# Patient Record
Sex: Male | Born: 1995
Health system: Southern US, Community
[De-identification: ages and names within clinical notes are randomized; demographics above are authoritative.]

## PROBLEM LIST (undated history)

## (undated) HISTORY — PX: NO PAST SURGERIES: SHX2092

---
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
## 2017-06-02 ENCOUNTER — Encounter: Payer: Self-pay | Admitting: *Deleted

## 2017-06-02 ENCOUNTER — Ambulatory Visit
Admission: EM | Admit: 2017-06-02 | Discharge: 2017-06-02 | Disposition: A | Discharge status: Home / Self Care | Payer: 59 | Attending: Family Medicine | Admitting: Family Medicine

## 2017-06-02 DIAGNOSIS — L42 Pityriasis rosea: Secondary | ICD-10-CM

## 2017-06-02 MED ORDER — HYDROXYZINE HCL 25 MG PO TABS: 25.0000 mg | ORAL_TABLET | Freq: Four times a day (QID) | ORAL | 0 refills | Status: DC | PRN

## 2017-06-02 NOTE — ED Triage Notes (Signed)
C/O rash to chest, back, and arms with itching. Started having these symptoms 3 days ago

## 2017-06-02 NOTE — Discharge Instructions (Signed)
Take medication as prescribed.Avoid scratching.  ° °Follow up with your primary care physician this week as needed. Return to Urgent care for new or worsening concerns.  ° °

## 2017-06-02 NOTE — ED Provider Notes (Signed)
[facility] ____________________________________________  Time seen: Approximately 10:23 AM  I have reviewed the triage vital signs and the nursing notes.   HISTORY  Chief Complaint Rash   HPI Dennis Maldonado is a 21 y.o. male presenting for evaluation of itchy rash that is been to his torso and upper extremities over the last 3 days.  States rash started to his torso and has gone outwards to his extremities.  States rash is itchy, but not painful or irritating.  Patient denies any known triggers.  Denies changes in foods, medicines, lotions, detergents or other contacts.  Denies others at home with similar.  Denies history of similar in the past.  Reports otherwise feels well.  Denies any cough congestion sickness, fevers, chest pain, shortness of breath, swelling or other complaints.  Denies any recent insect bite or tick bite. Denies recent sickness. Denies recent antibiotic use.  Denies any groin rash, or rash to hands or feet.  Denies concerns of STDs.   History reviewed. No pertinent past medical history. Denies There are no active problems to display for this patient.   History reviewed. No pertinent surgical history.   No current facility-administered medications for this encounter.   Current Outpatient Medications:  .  hydrOXYzine (ATARAX/VISTARIL) 25 MG tablet, Take 1 tablet (25 mg total) every 6 (six) hours as needed by mouth for itching., Disp: 20 tablet, Rfl: 0  Allergies Patient has no allergy information on record.  History reviewed. No pertinent family history.  Social History Social History   Tobacco Use  . Smoking status: Never Smoker  . Smokeless tobacco: Former Engineer, water Use Topics  . Alcohol use: No    Frequency: Never  . Drug use: Not on file    Review of Systems Constitutional: No fever/chills ENT: No sore throat. Cardiovascular: Denies chest pain. Respiratory: Denies shortness of breath. Gastrointestinal: No abdominal  pain.  No nausea, no vomiting.  Genitourinary: Negative for dysuria. Musculoskeletal: Negative for back pain. Skin: Positive for rash.   ____________________________________________   PHYSICAL EXAM:  VITAL SIGNS: ED Triage Vitals  Enc Vitals Group     BP 06/02/17 0943 (!) 114/59     Pulse Rate 06/02/17 0943 (!) 45 Recheck 56     Resp 06/02/17 0943 14     Temp 06/02/17 0943 98.1 F (36.7 C)     Temp Source 06/02/17 0943 Oral     SpO2 06/02/17 0943 100 %     Weight 06/02/17 0945 175 lb (79.4 kg)     Height 06/02/17 0945 5\' 11"  (1.803 m)     Head Circumference --      Peak Flow --      Pain Score --      Pain Loc --      Pain Edu? --      Excl. in GC? 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  Constitutional: Alert and oriented. Well appearing and in no acute distress. ENT      Head: Normocephalic and atraumatic.      Nose: No congestion/rhinnorhea. Neck: No stridor. Supple without meningismus.  Hematological/Lymphatic/Immunilogical: No cervical lymphadenopathy. Cardiovascular: Normal rate, regular rhythm. Grossly normal heart sounds.  Good peripheral circulation. Respiratory: Normal respiratory effort without tachypnea nor retractions. Breath sounds are clear and equal bilaterally. No wheezes, rales, rhonchi. Gastrointestinal: Soft and nontender. Musculoskeletal: Steady gait Neurologic:  Normal speech and language.Speech is normal. No gait instability.  Skin:  Skin is warm, dry.except: pruritic  dry scaly oval shaped patches throughout torso and upper extremities with larger approximately 2.5cm in diameter area to left back with similar appearance, no erythema, no drainage or swelling, nontender, no rash to lower extremities, no rash to palms or hands, and no burrows noted.  Psychiatric: Mood and affect are normal. Speech and behavior are normal. Patient exhibits appropriate insight and judgment   ___________________________________________     LABS (all labs ordered are listed, but only abnormal results are displayed)  Labs Reviewed - No data to display ____________________________________________  PROCEDURES Procedures     INITIAL IMPRESSION / ASSESSMENT AND PLAN / ED COURSE  Pertinent labs & imaging results that were available during my care of the patient were reviewed by me and considered in my medical decision making (see chart for details).  Well-appearing patient.  No acute distress.  Rash clinical appearance consistent with pityriasis rosea, with heralds patch. Will treat hydroxyzine as needed.  Also noted bradycardia, suspect patient baseline.  Patient reports frequent running and basketball playing.  Denies any cardiac history or current complaints, encourage follow up. Discussed indication, risks and benefits of medications with patient. Discussed indication, risks and benefits of medications with patient.  Discussed follow up with Primary care physician this week. Discussed follow up and return parameters including no resolution or any worsening concerns. Patient verbalized understanding and agreed to plan.   ____________________________________________   FINAL CLINICAL IMPRESSION(S) / ED DIAGNOSES  Final diagnoses:  Pityriasis rosea     This SmartLink is deprecated. Use AVSMEDLIST instead to display the medication list for a patient.  Note: This dictation was prepared with Dragon dictation along with smaller phrase technology. Any transcriptional errors that result from this process are unintentional.         Renford DillsMiller, [place], NP 06/02/17 [phone]

## 2018-09-16 ENCOUNTER — Other Ambulatory Visit: Payer: Self-pay

## 2018-09-16 ENCOUNTER — Ambulatory Visit
Admission: EM | Admit: 2018-09-16 | Discharge: 2018-09-16 | Disposition: A | Discharge status: Home / Self Care | Payer: 59 | Attending: Family Medicine | Admitting: Family Medicine

## 2018-09-16 ENCOUNTER — Ambulatory Visit (INDEPENDENT_AMBULATORY_CARE_PROVIDER_SITE_OTHER): Payer: 59

## 2018-09-16 DIAGNOSIS — R69 Illness, unspecified: Principal | ICD-10-CM

## 2018-09-16 DIAGNOSIS — R0602 Shortness of breath: Secondary | ICD-10-CM | POA: Diagnosis not present

## 2018-09-16 DIAGNOSIS — R062 Wheezing: Secondary | ICD-10-CM

## 2018-09-16 DIAGNOSIS — R509 Fever, unspecified: Secondary | ICD-10-CM | POA: Diagnosis not present

## 2018-09-16 DIAGNOSIS — J111 Influenza due to unidentified influenza virus with other respiratory manifestations: Secondary | ICD-10-CM

## 2018-09-16 DIAGNOSIS — R05 Cough: Secondary | ICD-10-CM | POA: Diagnosis not present

## 2018-09-16 LAB — RAPID INFLUENZA A&B ANTIGENS (ARMC ONLY): INFLUENZA B (ARMC): NEGATIVE

## 2018-09-16 LAB — RAPID INFLUENZA A&B ANTIGENS: Influenza A (ARMC): NEGATIVE

## 2018-09-16 MED ORDER — IBUPROFEN 800 MG PO TABS: 800.0000 mg | ORAL_TABLET | Freq: Three times a day (TID) | ORAL | 0 refills | Status: DC | PRN

## 2018-09-16 MED ORDER — OSELTAMIVIR PHOSPHATE 75 MG PO CAPS: 75.0000 mg | ORAL_CAPSULE | Freq: Two times a day (BID) | ORAL | 0 refills | Status: DC

## 2018-09-16 MED ORDER — BENZONATATE 100 MG PO CAPS: 100.0000 mg | ORAL_CAPSULE | Freq: Three times a day (TID) | ORAL | 0 refills | Status: DC | PRN

## 2018-09-16 MED ORDER — ALBUTEROL SULFATE (2.5 MG/3ML) 0.083% IN NEBU
2.5000 mg | INHALATION_SOLUTION | Freq: Once | RESPIRATORY_TRACT | Status: AC
  Administered 2018-09-16: 2.5 mg via RESPIRATORY_TRACT

## 2018-09-16 MED ORDER — ALBUTEROL SULFATE HFA 108 (90 BASE) MCG/ACT IN AERS: 1.0000 | INHALATION_SPRAY | Freq: Four times a day (QID) | RESPIRATORY_TRACT | 0 refills | Status: DC | PRN

## 2018-09-16 NOTE — Discharge Instructions (Signed)
Rest. Fluids.  Medications as directed.  Take care  Dr. Euel Castile 

## 2018-09-16 NOTE — ED Provider Notes (Signed)
[facility]    CSN: [id number] Arrival date & time: 09/16/18  0851  History   Chief Complaint Chief Complaint  Patient presents with  . Fever   HPI  23 year old male presents with fever, body aches, cough, and SOB.  Patient reports that his symptoms started last night.  He reports fever, T-max 101.  He has had body aches and back pain as well as cough.  Also reports shortness of breath.  Mother has given Tylenol and NyQuil without resolution.  Symptoms are severe.  Seems to be worsening.  No known exacerbating factors.  No other associated symptoms.  No other complaints.  History reviewed as below. PMH: Hx of pityriasis rosea  Past Surgical History:  Procedure Laterality Date  . NO PAST SURGERIES     Social History Social History   Tobacco Use  . Smoking status: Never Smoker  . Smokeless tobacco: Former Engineer, water Use Topics  . Alcohol use: No    Frequency: Never  . Drug use: Never    Allergies   Patient has no known allergies.  Review of Systems Review of Systems  Constitutional: Positive for fever.  Respiratory: Positive for cough and shortness of breath.   Musculoskeletal:       Back pain/body aches.   Physical Exam Triage Vital Signs ED Triage Vitals  Enc Vitals Group     BP 09/16/18 0912 116/71     Pulse Rate 09/16/18 0912 (!) 107     Resp 09/16/18 0912 (!) 22     Temp 09/16/18 0912 100.3 F (37.9 C)     Temp Source 09/16/18 0912 Oral     SpO2 09/16/18 0912 95 %     Weight 09/16/18 0908 160 lb (72.6 kg)     Height 09/16/18 0908 6' (1.829 m)     Head Circumference --      Peak Flow --      Pain Score 09/16/18 0908 7     Pain Loc --      Pain Edu? --      Excl. in GC? --    Updated Vital Signs BP 116/71 (BP Location: Left Arm)   Pulse (!) 107   Temp 100.3 F (37.9 C) (Oral)   Resp (!) 22   Ht 6' (1.829 m)   Wt 72.6 kg   SpO2 95%   BMI 21.70 kg/m   Visual Acuity Right Eye Distance:   Left Eye Distance:   Bilateral  Distance:    Right Eye Near:   Left Eye Near:    Bilateral Near:     Physical Exam Vitals signs and nursing note reviewed.  Constitutional:      General: He is not in acute distress.    Appearance: He is normal weight.  HENT:     Head: Normocephalic and atraumatic.     Right Ear: Tympanic membrane normal.     Left Ear: Tympanic membrane normal.     Nose: Nose normal.     Mouth/Throat:     Comments: Oropharynx with mild erythema.  No exudate. Eyes:     General:        Right eye: No discharge.        Left eye: No discharge.     Conjunctiva/sclera: Conjunctivae normal.  Cardiovascular:     Rate and Rhythm: Regular rhythm. Tachycardia present.  Pulmonary:     Breath sounds: Wheezing present. No rhonchi or rales.  Neurological:     Mental  Status: He is alert.  Psychiatric:        Behavior: Behavior normal.     Comments: Flat affect.    UC Treatments / Results  Labs (all labs ordered are listed, but only abnormal results are displayed) Labs Reviewed  RAPID INFLUENZA A&B ANTIGENS [facility] ONLY)    EKG None  Radiology Dg Chest 2 View  Result Date: 09/16/2018 CLINICAL DATA:  Fever and cough.  Shortness of breath EXAM: CHEST - 2 VIEW COMPARISON:  None. FINDINGS: Lungs are clear. Heart size and pulmonary vascularity are normal. No adenopathy. No bone lesions. IMPRESSION: No edema or consolidation. Electronically Signed   By: Bretta Bang III M.D.   On: 09/16/2018 10:20    Procedures Procedures (including critical care time)  Medications Ordered in UC Medications  albuterol (PROVENTIL) (2.5 MG/3ML) 0.083% nebulizer solution 2.5 mg (2.5 mg Nebulization Given 09/16/18 0938)    Initial Impression / Assessment and Plan / UC Course  I have reviewed the triage vital signs and the nursing notes.  Pertinent labs & imaging results that were available during my care of the patient were reviewed by me and considered in my medical decision making (see chart for details).      23 year old male presents with influenza-like illness.  Albuterol given today given wheezing on exam.  Chest x-ray was negative.  Influenza test was negative.  However, clinically I suspect influenza.  Treating him with Tamiflu, ibuprofen, Tessalon Perles, and albuterol.  Final Clinical Impressions(s) / UC Diagnoses   Final diagnoses:  Influenza-like illness     Discharge Instructions     Rest.  Fluids.  Medications as directed.  Take care  Dr. Adriana Simas     ED Prescriptions    Medication Sig Dispense Auth. Provider   oseltamivir (TAMIFLU) 75 MG capsule Take 1 capsule (75 mg total) by mouth every 12 (twelve) hours. 10 capsule Benay Pomeroy G, DO   benzonatate (TESSALON) 100 MG capsule Take 1 capsule (100 mg total) by mouth 3 (three) times daily as needed. 30 capsule [place]ingham Cohick G, DO   ibuprofen (ADVIL,MOTRIN) 800 MG tablet Take 1 tablet (800 mg total) by mouth every 8 (eight) hours as needed for moderate pain. 30 tablet Cherae Marton G, DO   albuterol (PROVENTIL HFA;VENTOLIN HFA) 108 (90 Base) MCG/ACT inhaler Inhale 1-2 puffs into the lungs every 6 (six) hours as needed for wheezing or shortness of breath. 1 Inhaler Tommie Sams, DO     Controlled Substance Prescriptions [place] Controlled Substance Registry consulted? Not Applicable   Tommie Sams, DO 09/16/18 1043

## 2018-09-16 NOTE — ED Triage Notes (Signed)
Patient complains of cough, fever, body aches, shortness of breath that started yesterday.

## 2018-09-20 ENCOUNTER — Ambulatory Visit
Admission: EM | Admit: 2018-09-20 | Discharge: 2018-09-20 | Disposition: A | Discharge status: Home / Self Care | Payer: 59 | Attending: Physician Assistant | Admitting: Physician Assistant

## 2018-09-20 ENCOUNTER — Other Ambulatory Visit: Payer: Self-pay

## 2018-09-20 DIAGNOSIS — J111 Influenza due to unidentified influenza virus with other respiratory manifestations: Secondary | ICD-10-CM

## 2018-09-20 DIAGNOSIS — J209 Acute bronchitis, unspecified: Secondary | ICD-10-CM | POA: Diagnosis not present

## 2018-09-20 MED ORDER — PREDNISONE 20 MG PO TABS: 40.0000 mg | ORAL_TABLET | Freq: Every day | ORAL | 0 refills | Status: AC

## 2018-09-20 MED ORDER — PSEUDOEPH-BROMPHEN-DM 30-2-10 MG/5ML PO SYRP: ORAL_SOLUTION | ORAL | 0 refills | Status: DC

## 2018-09-20 NOTE — ED Provider Notes (Signed)
[facility]    CSN: [id number] Arrival date & time: 09/20/18  0910     History   Chief Complaint No chief complaint on file.   HPI Dennis Maldonado is a 23 y.o. male. Patient returns today for follow up of influenza diagnosed at [facility] four days ago. Patient tested negative for influenza and displayed an normal chest x-ray. He was clinically treated for the flu with tamiflu, benzonatate, albuterol inhaler, and ibuprofen. He says that he does feel a little better from his previous visit, but is concerned since he has had continued temps up to 100 degrees. He says he continues to have headaches although they are not as bad. He denies chills, sweats, and fatigue is improving. He denies sinus pain, nasal congestion, chest pain, wheezing and breathing problems. He says he believes the cough medication is not helping and he may need a few more days to rest. He has no other concerns today and says symptoms have not worsened, and again, seem to be improving.   HPI  No past medical history on file.  There are no active problems to display for this patient.   Past Surgical History:  Procedure Laterality Date  . NO PAST SURGERIES         Home Medications    Prior to Admission medications   Medication Sig Start Date End Date Taking? Authorizing Provider  albuterol (PROVENTIL HFA;VENTOLIN HFA) 108 (90 Base) MCG/ACT inhaler Inhale 1-2 puffs into the lungs every 6 (six) hours as needed for wheezing or shortness of breath. 09/16/18  Yes Cook, Jayce G, DO  benzonatate (TESSALON) 100 MG capsule Take 1 capsule (100 mg total) by mouth 3 (three) times daily as needed. 09/16/18  Yes Tommie Sams, DO  oseltamivir (TAMIFLU) 75 MG capsule Take 1 capsule (75 mg total) by mouth every 12 (twelve) hours. 09/16/18  Yes Everlene Other G, DO  brompheniramine-pseudoephedrine-DM 30-2-10 MG/5ML syrup Take 5-10 ml q6h for cough x 7 days 09/20/18   Shirlee Latch, PA-C  ibuprofen (ADVIL,MOTRIN)  800 MG tablet Take 1 tablet (800 mg total) by mouth every 8 (eight) hours as needed for moderate pain. 09/16/18   Tommie Sams, DO  predniSONE (DELTASONE) 20 MG tablet Take 2 tablets (40 mg total) by mouth daily for 5 days. 09/20/18 09/25/18  Shirlee Latch, PA-C    Family History No family history on file.  Social History Social History   Tobacco Use  . Smoking status: Never Smoker  . Smokeless tobacco: Former Engineer, water Use Topics  . Alcohol use: No    Frequency: Never  . Drug use: Never     Allergies   Patient has no known allergies.   Review of Systems Review of Systems  Constitutional: Positive for fatigue and fever. Negative for chills.  HENT: Positive for congestion. Negative for ear pain, rhinorrhea, sinus pressure, sinus pain and sore throat.   Respiratory: Positive for cough. Negative for shortness of breath and wheezing.   Cardiovascular: Negative for chest pain.  Gastrointestinal: Negative for abdominal pain, nausea and vomiting.  Musculoskeletal: Negative for myalgias, neck pain and neck stiffness.  Neurological: Positive for headaches. Negative for dizziness, weakness and light-headedness.  Hematological: Negative for adenopathy.     Physical Exam Triage Vital Signs ED Triage Vitals  Enc Vitals Group     BP 09/20/18 0921 113/78     Pulse Rate 09/20/18 0921 86     Resp 09/20/18 0921 16  Temp 09/20/18 0921 98.5 F (36.9 C)     Temp Source 09/20/18 0921 Oral     SpO2 09/20/18 0921 96 %     Weight 09/20/18 0923 160 lb (72.6 kg)     Height --      Head Circumference --      Peak Flow --      Pain Score 09/20/18 0923 7     Pain Loc --      Pain Edu? --      Excl. in GC? --    No data found.  Updated Vital Signs BP 113/78 (BP Location: Left Arm)   Pulse 86   Temp 98.5 F (36.9 C) (Oral)   Resp 16   Wt 160 lb (72.6 kg)   SpO2 96%   BMI 21.70 kg/m       Physical Exam Vitals signs and nursing note reviewed.  Constitutional:       General: He is not in acute distress.    Appearance: Normal appearance. He is normal weight. He is not ill-appearing.  HENT:     Head: Normocephalic and atraumatic.     Right Ear: Tympanic membrane, ear canal and external ear normal.     Left Ear: Tympanic membrane, ear canal and external ear normal.     Nose: Nose normal. No congestion or rhinorrhea.     Mouth/Throat:     Mouth: Mucous membranes are moist.     Pharynx: Oropharynx is clear. No posterior oropharyngeal erythema.  Eyes:     General: No scleral icterus.       Right eye: No discharge.        Left eye: No discharge.     Conjunctiva/sclera: Conjunctivae normal.  Neck:     Musculoskeletal: Neck supple. No neck rigidity.  Cardiovascular:     Rate and Rhythm: Normal rate and regular rhythm.     Heart sounds: Normal heart sounds. No murmur.  Pulmonary:     Effort: Pulmonary effort is normal. No respiratory distress.     Breath sounds: Normal breath sounds. No wheezing or rhonchi.  Abdominal:     Palpations: Abdomen is soft.     Tenderness: There is no abdominal tenderness.  Lymphadenopathy:     Cervical: No cervical adenopathy.  Skin:    General: Skin is warm and dry.     Findings: No rash.  Neurological:     Mental Status: He is alert.  Psychiatric:        Mood and Affect: Mood normal.        Behavior: Behavior normal.        Thought Content: Thought content normal.      UC Treatments / Results  Labs (all labs ordered are listed, but only abnormal results are displayed) Labs Reviewed - No data to display  EKG None  Radiology No results found.  Procedures Procedures (including critical care time)  Medications Ordered in UC Medications - No data to display  Initial Impression / Assessment and Plan / UC Course  I have reviewed the triage vital signs and the nursing notes.  Pertinent labs & imaging results that were available during my care of the patient were reviewed by me and considered in my medical  decision making (see chart for details).      Final Clinical Impressions(s) / UC Diagnoses   Final diagnoses:  Influenza  Acute bronchitis, unspecified organism     Discharge Instructions     FLU: Stressed the need to  increase rest and fluid/electrolyte intake . Use medications as directed including Tamiflu and OTC cough medications. If breathing becomes a concern follow back up with [place] or go to the ER. Avoid others and do not return to work/school too soon in order to prevent others from becoming infected. Most get better within 1-2 wks. Call PCP if trouble breathing or are short of breath, feel pain or pressure in your chest or abdomen, get dizzy, feel confused, or have vomiting .   BRONCHITIS: Take Mucinex D or prescribed cough medication throughout the day and drink plenty of fluids to break up the mucus . Take cough syrup at bedtime if needed, for cough and to assist with sleep. Take Ibuprofen or other NSAID for relief of any pleuritic pain. Increase rest and fluid intake. Return to the clinic, your PCP, or ER if you have any new/ worsening symptoms such as fever, chest pain, difficulty breathing, worsening cough, mental status changes, lethargy, etc.     ED Prescriptions    Medication Sig Dispense Auth. Provider   predniSONE (DELTASONE) 20 MG tablet Take 2 tablets (40 mg total) by mouth daily for 5 days. 10 tablet Eusebio Friendly B, PA-C   brompheniramine-pseudoephedrine-DM 30-2-10 MG/5ML syrup Take 5-10 ml q6h for cough x 7 days 250 mL Eusebio Friendly B, PA-C     Controlled Substance Prescriptions [place] Controlled Substance Registry consulted? Not Applicable   Gareth Morgan 09/20/18 1610

## 2018-09-20 NOTE — Discharge Instructions (Addendum)
FLU: Stressed the need to increase rest and fluid/electrolyte intake . Use medications as directed including Tamiflu and OTC cough medications. If breathing becomes a concern follow back up with [place] or go to the ER. Avoid others and do not return to work/school too soon in order to prevent others from becoming infected. Most get better within 1-2 wks. Call PCP if trouble breathing or are short of breath, feel pain or pressure in your chest or abdomen, get dizzy, feel confused, or have vomiting .   BRONCHITIS: Take Mucinex D or prescribed cough medication throughout the day and drink plenty of fluids to break up the mucus . Take cough syrup at bedtime if needed, for cough and to assist with sleep. Take Ibuprofen or other NSAID for relief of any pleuritic pain. Increase rest and fluid intake. Return to the clinic, your PCP, or ER if you have any new/ worsening symptoms such as fever, chest pain, difficulty breathing, worsening cough, mental status changes, lethargy, etc.

## 2018-09-20 NOTE — ED Triage Notes (Signed)
Per patient was seen x 4 days ago for fever/ headache, per patient still having a fever and not getting better

## 2019-12-28 ENCOUNTER — Ambulatory Visit: Payer: Self-pay | Admitting: Physician Assistant

## 2019-12-28 ENCOUNTER — Encounter: Payer: Self-pay | Admitting: Physician Assistant

## 2019-12-28 ENCOUNTER — Other Ambulatory Visit: Payer: Self-pay

## 2019-12-28 DIAGNOSIS — Z202 Contact with and (suspected) exposure to infections with a predominantly sexual mode of transmission: Secondary | ICD-10-CM

## 2019-12-28 DIAGNOSIS — Z113 Encounter for screening for infections with a predominantly sexual mode of transmission: Secondary | ICD-10-CM

## 2019-12-28 LAB — GRAM STAIN

## 2019-12-28 MED ORDER — DOXYCYCLINE HYCLATE 100 MG PO TABS: 100.0000 mg | ORAL_TABLET | Freq: Two times a day (BID) | ORAL | 0 refills | Status: AC

## 2019-12-28 MED ORDER — CEFTRIAXONE SODIUM 250 MG IJ SOLR
500.0000 mg | Freq: Once | INTRAMUSCULAR | Status: AC
  Administered 2019-12-28: 500 mg via INTRAMUSCULAR

## 2019-12-28 NOTE — Progress Notes (Signed)
   [facility] Department STI clinic/screening visit  Subjective:  Dennis Maldonado is a 24 y.o. male being seen today for an STI screening visit. The patient reports they do not have symptoms.    Patient has the following medical conditions:  There are no problems to display for this patient.    Chief Complaint  Patient presents with  . SEXUALLY TRANSMITTED DISEASE    screening    HPI  Patient reports that he does not have any symptoms but is a contact to GC.  Denies chronic conditions, surgeries and regular medications.  Denies previous HIV testing.   See flowsheet for further details and programmatic requirements.    The following portions of the patient's history were reviewed and updated as appropriate: allergies, current medications, past medical history, past social history, past surgical history and problem list.  Objective:  There were no vitals filed for this visit.  Physical Exam Constitutional:      General: He is not in acute distress.    Appearance: Normal appearance.  HENT:     Head: Normocephalic and atraumatic.     Comments: No nits, lice or hair loss. No cervical, supraclavicular or axillary adenopathy.     Mouth/Throat:     Mouth: Mucous membranes are moist.     Pharynx: Oropharynx is clear. No oropharyngeal exudate or posterior oropharyngeal erythema.  Eyes:     Conjunctiva/sclera: Conjunctivae normal.  Pulmonary:     Effort: Pulmonary effort is normal.  Abdominal:     Palpations: Abdomen is soft. There is no mass.     Tenderness: There is no abdominal tenderness. There is no guarding or rebound.  Genitourinary:    Penis: Normal.      Testes: Normal.     Comments: Pubic area without nits, lice, edema,erythema, lesions and inguinal adenopathy. Penis circumcised, without rash, lesions and discharge from meatus. Musculoskeletal:     Cervical back: Neck supple. No tenderness.  Skin:    General: Skin is warm and dry.     Findings: No  bruising, erythema, lesion or rash.  Neurological:     Mental Status: He is alert and oriented to person, place, and time.  Psychiatric:        Mood and Affect: Mood normal.        Behavior: Behavior normal.        Thought Content: Thought content normal.        Judgment: Judgment normal.       Assessment and Plan:  Dennis Maldonado is a 24 y.o. male presenting to the [facility] for STI screening  1. Screening for STD (sexually transmitted disease) Patient into clinic without symptoms. Rec condoms with all sex. Await test results.  Counseled that RN will call if needs to RTC for further treatment once results are back.  - Gram stain - Gonococcus culture - HIV/HCV [place] [facility] - Syphilis Serology,  Lab  2. Gonorrhea contact Will treat as a contact to [facility] with Ceftriaxone 500mg  IM and Doxycycline 100mg  #14 1 po BID for 7 days. No sex until 7 days after medicine completed and until after partner completes treatment. - cefTRIAXone (ROCEPHIN) injection 500 mg - doxycycline (VIBRA-TABS) 100 MG tablet; Take 1 tablet (100 mg total) by mouth 2 (two) times daily for 7 days.  Dispense: 14 tablet; Refill: 0     No follow-ups on file.  No future appointments.  , PA

## 2020-01-01 LAB — GONOCOCCUS CULTURE

## 2020-01-02 NOTE — Progress Notes (Signed)
Chart reviewed by Pharmacist  Suzanne Walker PharmD, Contract Pharmacist at [facility]  

## 2020-01-05 LAB — HM HIV SCREENING LAB: HM HIV Screening: NEGATIVE

## 2020-01-05 LAB — HM HEPATITIS C SCREENING LAB: HM Hepatitis Screen: NEGATIVE

## 2020-01-07 ENCOUNTER — Encounter: Payer: Self-pay | Admitting: Family Medicine

## 2020-02-27 IMAGING — CR DG CHEST 2V
2 series · 3 of 3 positions shown · non-contrast
Comparison: None.

CLINICAL DATA: Fever and cough.  Shortness of breath

EXAM:
CHEST - 2 VIEW

[Series 1: chest pa · 0.14mm/px · 2 of 2 slices shown]
[im 1/2]
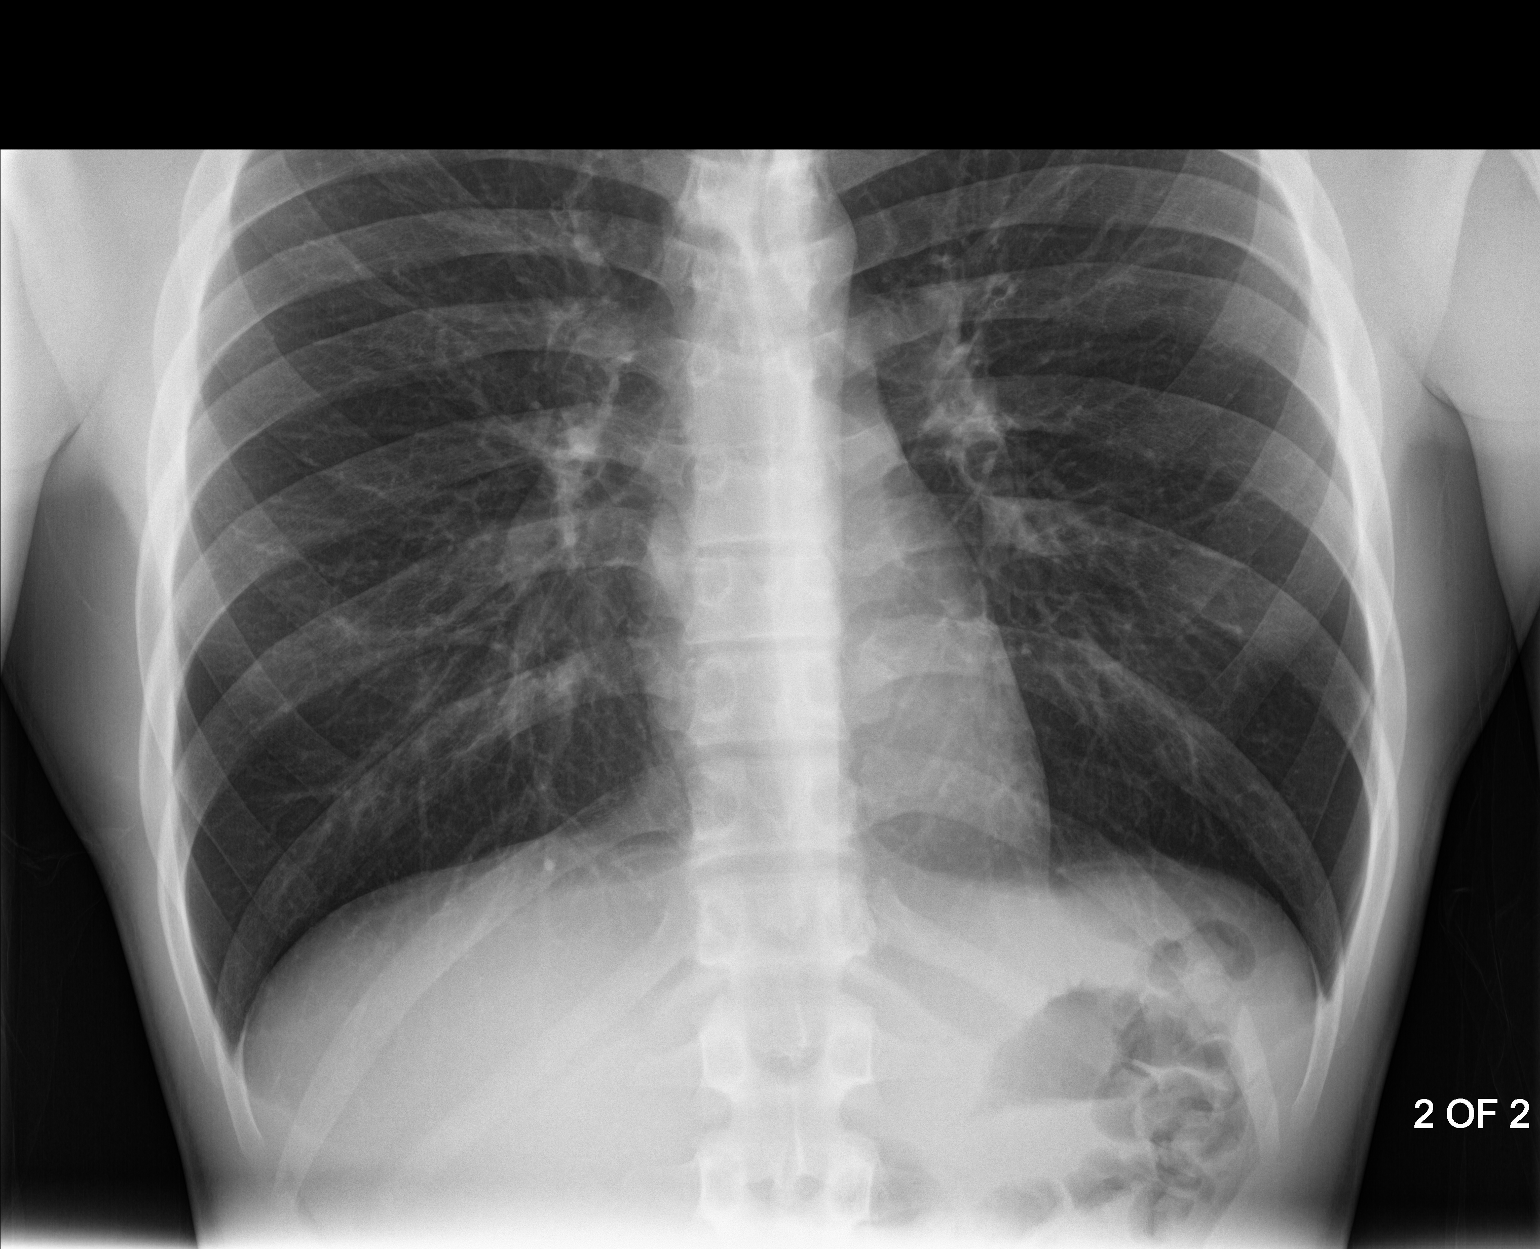
[im 2/2]
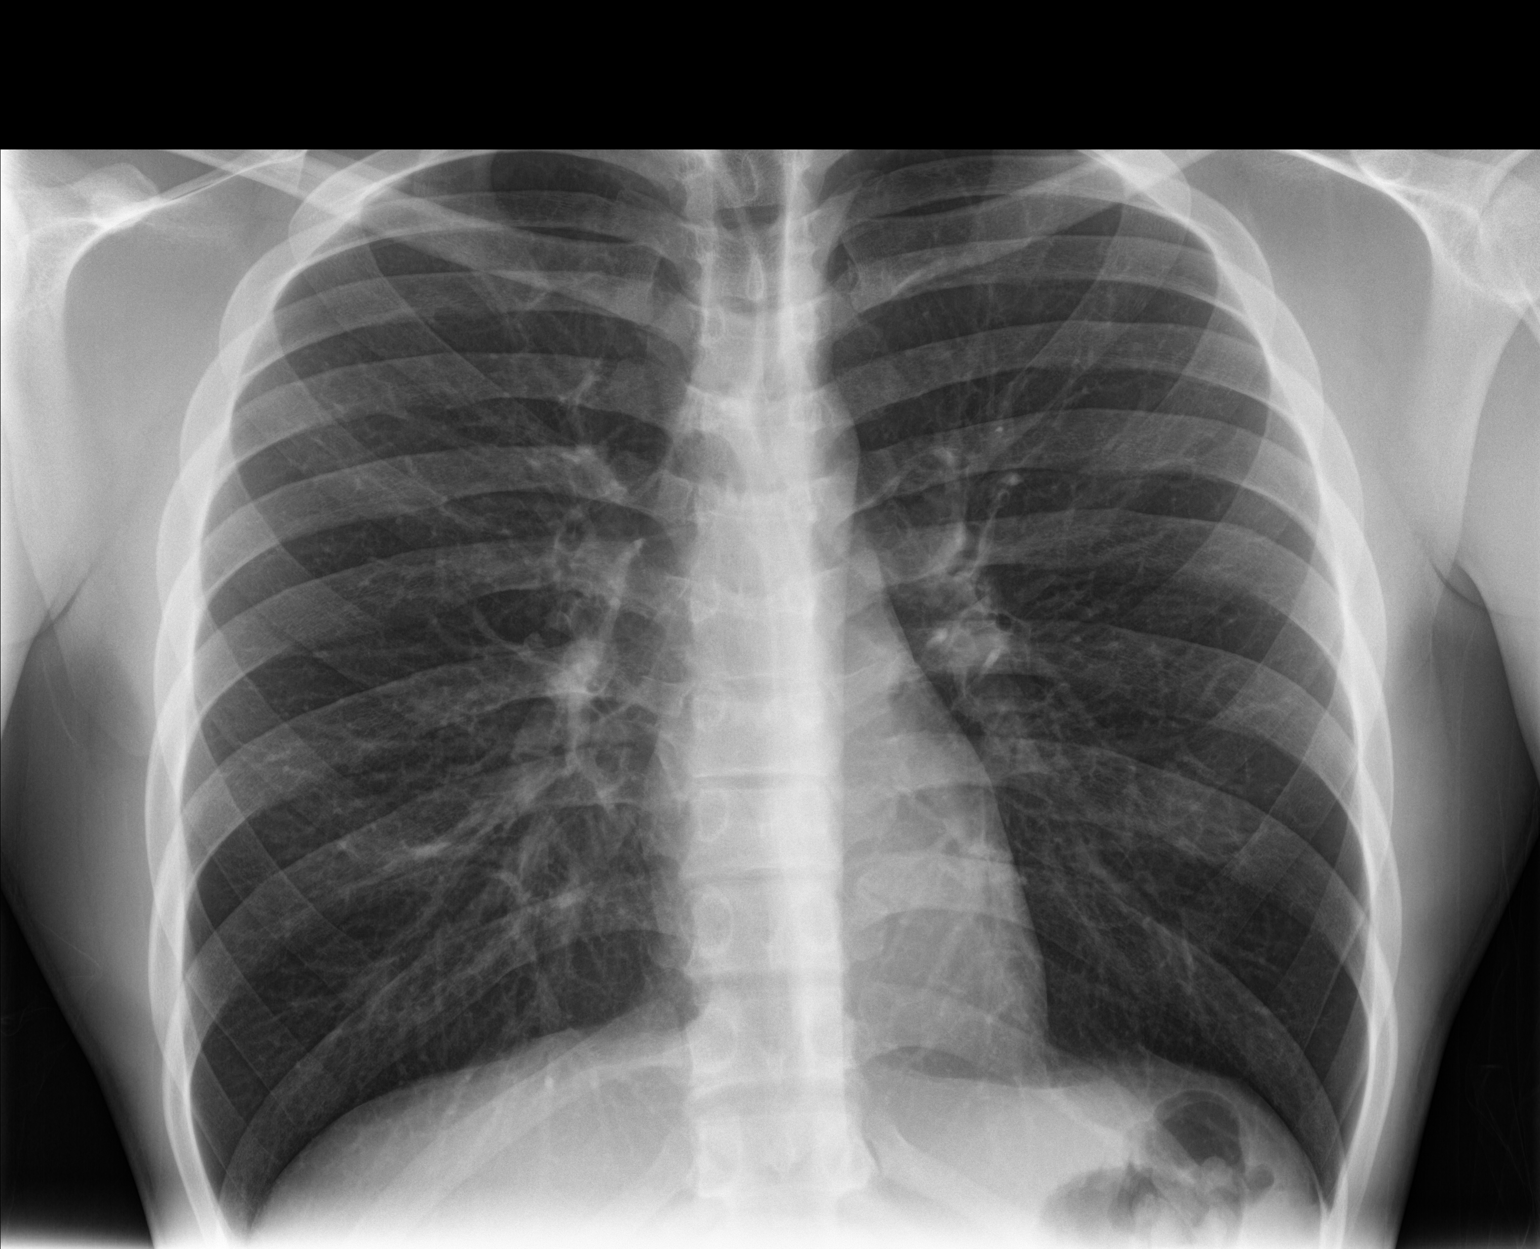

[chest lat]
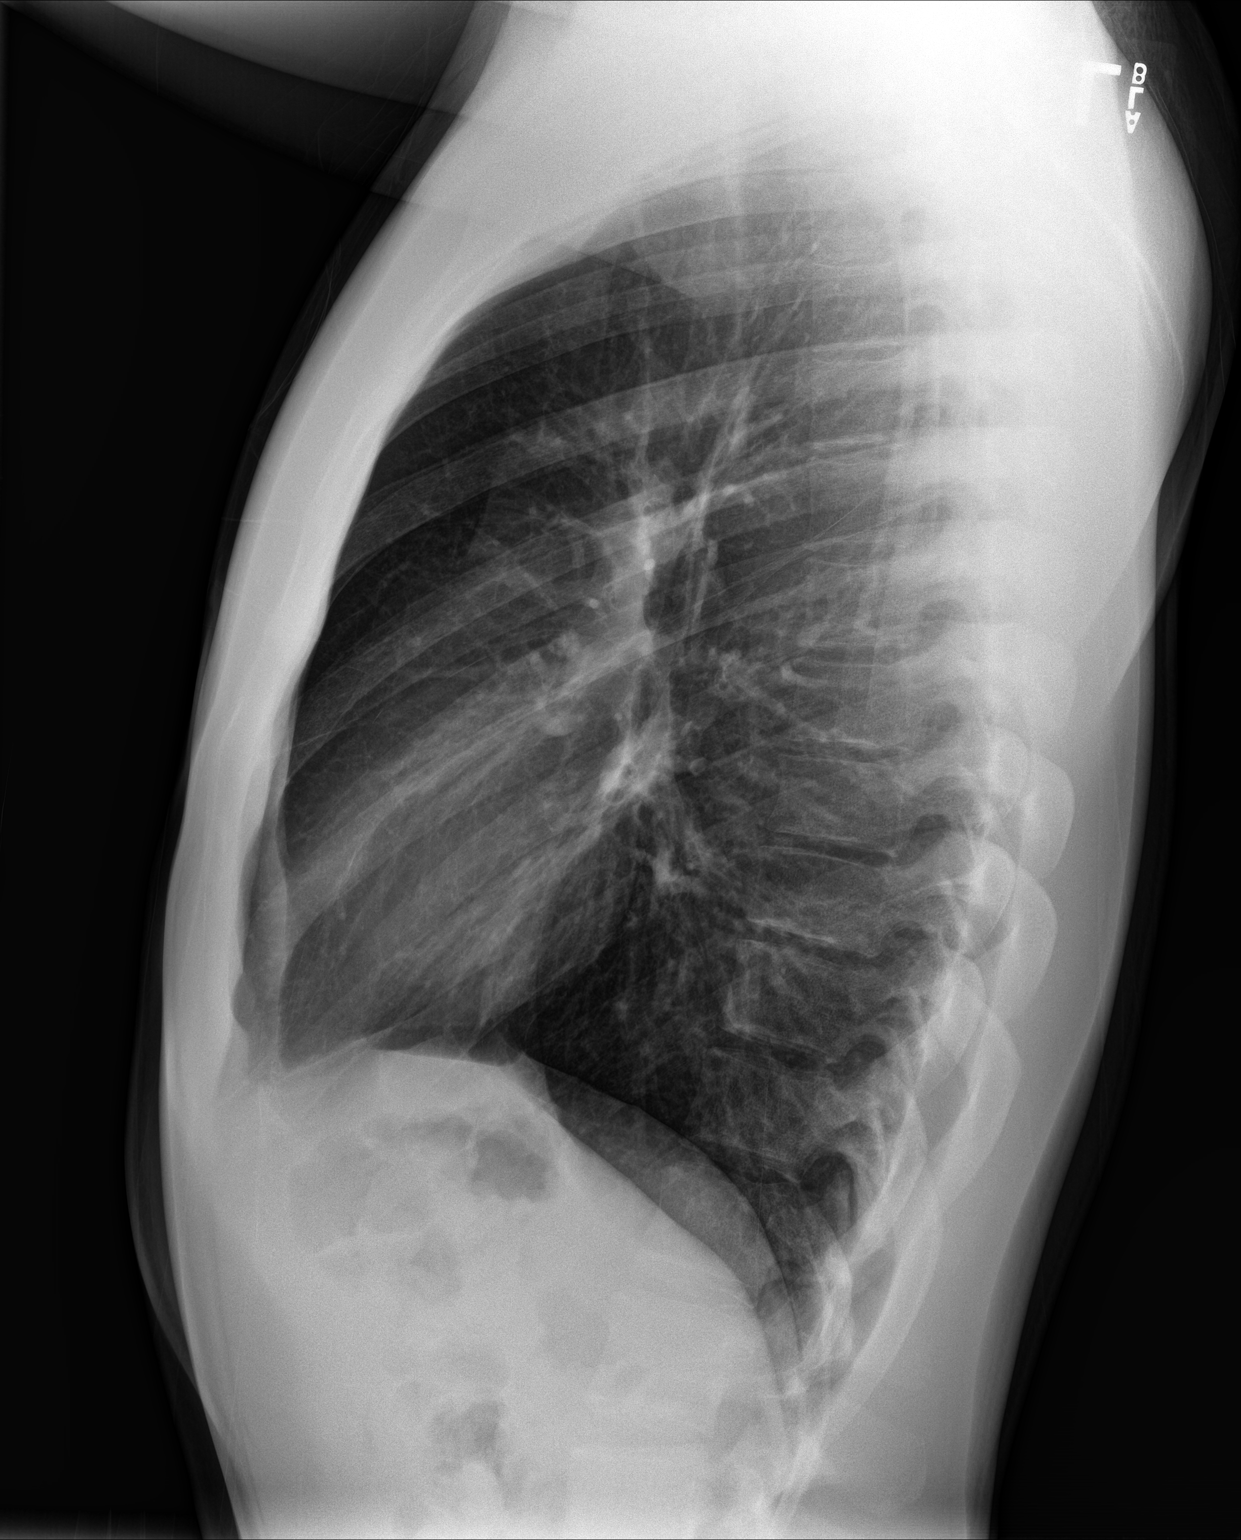

[3 of 3 positions shown; findings below may reference images not displayed]

FINDINGS: Lungs are clear. Heart size and pulmonary vascularity are normal. No
adenopathy. No bone lesions.
IMPRESSION: No edema or consolidation.

## 2020-08-10 ENCOUNTER — Ambulatory Visit
Admission: EM | Admit: 2020-08-10 | Discharge: 2020-08-10 | Disposition: A | Discharge status: Home / Self Care | Payer: Self-pay | Attending: Sports Medicine | Admitting: Sports Medicine

## 2020-08-10 ENCOUNTER — Encounter: Payer: Self-pay | Admitting: Emergency Medicine

## 2020-08-10 ENCOUNTER — Other Ambulatory Visit: Payer: Self-pay

## 2020-08-10 DIAGNOSIS — Z4802 Encounter for removal of sutures: Secondary | ICD-10-CM

## 2020-08-10 DIAGNOSIS — S0101XA Laceration without foreign body of scalp, initial encounter: Secondary | ICD-10-CM

## 2020-08-10 NOTE — ED Provider Notes (Signed)
[facility]    CSN: [id number] Arrival date & time: 08/10/20  1406      History   Chief Complaint Chief Complaint  Patient presents with  . Suture / Staple Removal    Need to see provider    HPI Dennis Maldonado is a 25 y.o. male.   Pleasant 25 year old male who presents for evaluation of a closed head injury with a laceration to the occipital lobe.  It occurred on July 30, 2020.  He was helping a friend tear down a house and ended up with some glass in the back of the head.  He had 2 lacerations that were 2 cm each.  He had 4 staples total placed at the emergency room at [facility].  He was told to follow-up in 10 days and have his staples removed.  Denies any fever shakes chills.  No nausea vomiting diarrhea.  She denies any significant headaches vision changes.  Other than the staple removal he endorses no symptoms at all today.     History reviewed. No pertinent past medical history.  There are no problems to display for this patient.   Past Surgical History:  Procedure Laterality Date  . NO PAST SURGERIES         Home Medications    Prior to Admission medications   Medication Sig Start Date End Date Taking? Authorizing Provider  albuterol (PROVENTIL HFA;VENTOLIN HFA) 108 (90 Base) MCG/ACT inhaler Inhale 1-2 puffs into the lungs every 6 (six) hours as needed for wheezing or shortness of breath. 09/16/18   Tommie Sams, DO  benzonatate (TESSALON) 100 MG capsule Take 1 capsule (100 mg total) by mouth 3 (three) times daily as needed. 09/16/18   Tommie Sams, DO  brompheniramine-pseudoephedrine-DM 30-2-10 MG/5ML syrup Take 5-10 ml q6h for cough x 7 days 09/20/18   Shirlee Latch, PA-C  ibuprofen (ADVIL,MOTRIN) 800 MG tablet Take 1 tablet (800 mg total) by mouth every 8 (eight) hours as needed for moderate pain. 09/16/18   Tommie Sams, DO  oseltamivir (TAMIFLU) 75 MG capsule Take 1 capsule (75 mg total) by mouth every 12 (twelve) hours. 09/16/18   Tommie Sams, DO     Family History History reviewed. No pertinent family history.  Social History Social History   Tobacco Use  . Smoking status: Never Smoker  . Smokeless tobacco: Former Clinical biochemist  . Vaping Use: Never used  Substance Use Topics  . Alcohol use: No  . Drug use: Never     Allergies   Patient has no known allergies.   Review of Systems Review of Systems  Constitutional: Negative for activity change, appetite change and fever.  HENT: Negative.   Eyes: Negative.  Negative for pain and visual disturbance.  Respiratory: Negative.   Cardiovascular: Negative.   Gastrointestinal: Negative.   Genitourinary: Negative.   Musculoskeletal: Negative.   Skin: Positive for wound. Negative for color change, pallor and rash.  Neurological: Negative for dizziness, tremors, weakness, light-headedness and headaches.     Physical Exam Triage Vital Signs ED Triage Vitals  Enc Vitals Group     BP 08/10/20 1436 118/79     Pulse Rate 08/10/20 1436 68     Resp 08/10/20 1436 16     Temp 08/10/20 1436 98.8 F (37.1 C)     Temp Source 08/10/20 1436 Oral     SpO2 08/10/20 1436 100 %     Weight 08/10/20 1432 175 lb (79.4 kg)  Height 08/10/20 1432 6\' 1"  (1.854 m)     Head Circumference --      Peak Flow --      Pain Score 08/10/20 1432 0     Pain Loc --      Pain Edu? --      Excl. in [place]? --    No data found.  Updated Vital Signs BP 118/79 (BP Location: Left Arm)   Pulse 68   Temp 98.8 F (37.1 C) (Oral)   Resp 16   Ht 6\' 1"  (1.854 m)   Wt 79.4 kg   SpO2 100%   BMI 23.09 kg/m   Visual Acuity Right Eye Distance:   Left Eye Distance:   Bilateral Distance:    Right Eye Near:   Left Eye Near:    Bilateral Near:     Physical Exam Vitals and nursing note reviewed.  Constitutional:      General: He is not in acute distress.    Appearance: Normal appearance. He is not ill-appearing or toxic-appearing.  HENT:     Head: Normocephalic.     Comments: Patient has 4  staples in the occipital region.  There is no erythema.  There is no drainage.  No evidence of any infection.  The staples were removed without incident.  A Band-Aid was placed over an area that was bleeding very minimally. Eyes:     Extraocular Movements: Extraocular movements intact.     Pupils: Pupils are equal, round, and reactive to light.  Skin:    General: Skin is warm.     Capillary Refill: Capillary refill takes less than 2 seconds.     Findings: No erythema or rash.  Neurological:     General: No focal deficit present.     Mental Status: He is alert and oriented to person, place, and time.      UC Treatments / Results  Labs (all labs ordered are listed, but only abnormal results are displayed) Labs Reviewed - No data to display  EKG   Radiology No results found.  Procedures Procedures (including critical care time)  Medications Ordered in UC Medications - No data to display  Initial Impression / Assessment and Plan / UC Course  I have reviewed the triage vital signs and the nursing notes.  Pertinent labs & imaging results that were available during my care of the patient were reviewed by me and considered in my medical decision making (see chart for details).  Clinical impression laceration to the occipital region on January 2.  Patient had 4 staples placed at [facility].  Treatment plan: 1.  4 staples were removed here in the office.  Minimal blood loss. 2.  Keep the area clean and dry for several days until it closes over and then he can shower and get it wet. 3.  Informed him he should not get a haircut for about 10 to 14 days.  He voiced verbal understanding. 4. Any problems he knows to come back otherwise we will see him back as needed.    Final Clinical Impressions(s) / UC Diagnoses   Final diagnoses:  Visit for suture removal  Laceration of scalp without foreign body, initial encounter     Discharge Instructions     Keep the area clean and dry for  several days until it closes over and then he can shower and get it wet. Informed him he should not get a haircut for about 10 to 14 days.  He voiced verbal understanding.  Any problems he knows to come back otherwise we will see him back as needed.    ED Prescriptions    None     PDMP not reviewed this encounter.   Delton See, MD 08/10/20 [phone]

## 2020-08-10 NOTE — ED Triage Notes (Signed)
Patient states that he is here to have his staples taken out at the back of his head.  Patient states that they were placed at [facility] on last Sunday.

## 2020-08-10 NOTE — Discharge Instructions (Addendum)
Keep the area clean and dry for several days until it closes over and then he can shower and get it wet. Informed him he should not get a haircut for about 10 to 14 days.  He voiced verbal understanding. Any problems he knows to come back otherwise we will see him back as needed.

## 2021-10-15 ENCOUNTER — Other Ambulatory Visit: Payer: Self-pay

## 2021-10-15 ENCOUNTER — Ambulatory Visit (INDEPENDENT_AMBULATORY_CARE_PROVIDER_SITE_OTHER): Payer: BC Managed Care – PPO | Admitting: Family Medicine

## 2021-10-15 ENCOUNTER — Encounter: Payer: Self-pay | Admitting: Family Medicine

## 2021-10-15 VITALS — BP 122/78 | HR 66 | Ht 73.0 in | Wt 166.2 lb

## 2021-10-15 DIAGNOSIS — Z1159 Encounter for screening for other viral diseases: Secondary | ICD-10-CM

## 2021-10-15 DIAGNOSIS — Z114 Encounter for screening for human immunodeficiency virus [HIV]: Secondary | ICD-10-CM

## 2021-10-15 DIAGNOSIS — Z0000 Encounter for general adult medical examination without abnormal findings: Secondary | ICD-10-CM | POA: Diagnosis not present

## 2021-10-15 DIAGNOSIS — Z13220 Encounter for screening for lipoid disorders: Secondary | ICD-10-CM

## 2021-10-15 DIAGNOSIS — R7989 Other specified abnormal findings of blood chemistry: Secondary | ICD-10-CM | POA: Diagnosis not present

## 2021-10-15 NOTE — Patient Instructions (Signed)
-   Obtain fasting labs with orders provided (can have water or black coffee but otherwise no food or drink x 8 hours before labs) °- Review information provided °- Attend eye doctor annually, dentist every 6 months, work towards or maintain 30 minutes of moderate intensity physical activity at least 5 days per week, and consume a balanced diet °- Return in 1 year for physical °- Contact us for any questions between now and then °

## 2021-10-15 NOTE — Assessment & Plan Note (Signed)
Annual examination completed, risk stratification labs ordered, anticipatory guidance provided.  We will follow labs once resulted.  Patient has deferred HPV, is uncertain if he has had this series previously through his pediatrician.  Will obtain outside records for review and to update our own chart. ?

## 2021-10-15 NOTE — Progress Notes (Signed)
?  ? ?Annual Physical Exam Visit ? ?Patient Information:  ?Patient ID: Dennis Maldonado, male DOB: 1996-03-06 Age: 26 y.o. MRN: [id number]  ? ?Subjective:  ? ?CC: Annual Physical Exam ? ?HPI:  ?Dennis Maldonado is here for their annual physical. ? ?I reviewed the past medical history, family history, social history, surgical history, and allergies today and changes were made as necessary.  Please see the problem list section below for additional details. ? ?Past Medical History: ?History reviewed. No pertinent past medical history. ?Past Surgical History: ?Past Surgical History:  ?Procedure Laterality Date  ? NO PAST SURGERIES    ? ?Family History: ?Family History  ?Problem Relation Age of Onset  ? Hypertension Maternal Grandmother   ? Cancer Maternal Grandmother   ?     Liver  ? Cancer Maternal Grandfather   ?     Colon  ? ?Allergies: ?No Known Allergies ?Health Maintenance: ?Health Maintenance  ?Topic Date Due  ? HPV VACCINES (1 - Male 2-dose series) Never done  ? INFLUENZA VACCINE  10/26/2021 (Originally 02/26/2021)  ? COVID-19 Vaccine (1) 10/31/2021 (Originally [birth year]/1998)  ? TETANUS/TDAP  10/15/2029  ? Hepatitis C Screening  Completed  ? HIV Screening  Completed  ?  ?HM Colonoscopy   ? ? This patient has no relevant Health Maintenance data.  ? ?  ? ?Medications: ?No current outpatient medications on file prior to visit.  ? ?No current facility-administered medications on file prior to visit.  ? ? ?Review of Systems: No headache, visual changes, nausea, vomiting, diarrhea, constipation, dizziness, abdominal pain, skin rash, fevers, chills, night sweats, swollen lymph nodes, weight loss, chest pain, body aches, joint swelling, muscle aches, shortness of breath, mood changes, visual or auditory hallucinations reported. ? ?Objective:  ? ?Vitals:  ? 10/15/21 0802  ?BP: 122/78  ?Pulse: 66  ?SpO2: 99%  ? ?Vitals:  ? 10/15/21 0802  ?Weight: 166 lb 3.2 oz (75.4 kg)  ?Height: 6\' 1"  (1.854 m)  ? ?Body mass index is 21.93  kg/m?. ? ?General: Well Developed, well nourished, and in no acute distress.  ?Neuro: Alert and oriented x3, extra-ocular muscles intact, sensation grossly intact. Cranial nerves II through XII are grossly intact, motor, sensory, and coordinative functions are intact. ?HEENT: Normocephalic, atraumatic, pupils equal round reactive to light, neck supple, no masses, no lymphadenopathy, thyroid nonpalpable. Oropharynx, nasopharynx with mild mucus noted about the turbinates, external ear canals are unremarkable. ?Skin: Warm and dry, no rashes noted.  Onychomycoses of all toes noted. ?Cardiac: Regular rate and rhythm, no murmurs rubs or gallops. No peripheral edema. Pulses symmetric. ?Respiratory: Clear to auscultation bilaterally. Not using accessory muscles, speaking in full sentences.  ?Abdominal: Soft, nontender, nondistended, positive bowel sounds, no masses, no organomegaly. ?Musculoskeletal: Shoulder, elbow, wrist, hip, knee, ankle stable, and with full range of motion. ? ?Impression and Recommendations:  ? ?The patient was counselled, risk factors were discussed, and anticipatory guidance given. ? ?Annual physical exam ?Annual examination completed, risk stratification labs ordered, anticipatory guidance provided.  We will follow labs once resulted.  Patient has deferred HPV, is uncertain if he has had this series previously through his pediatrician.  Will obtain outside records for review and to update our own chart. ? ?Orders & Medications ?Medications: No orders of the defined types were placed in this encounter. ? ?Orders Placed This Encounter  ?Procedures  ? Apo A1 + B + Ratio  ? CBC  ? Comprehensive metabolic panel  ? Hepatitis C antibody  ? HIV Antibody (routine  testing w rflx)  ? Lipid panel  ? TSH  ? VITAMIN D 25 Hydroxy (Vit-D Deficiency, Fractures)  ?  ? ?Return in about 1 year (around 10/16/2022) for Annual Physical.  ? ? ?Montel Culver, MD ? ? Primary Care Sports Medicine ?[facility] ?[place]  ? ?

## 2021-10-16 LAB — COMPREHENSIVE METABOLIC PANEL
ALT: 21 IU/L (ref 0–44)
AST: 18 IU/L (ref 0–40)
Albumin/Globulin Ratio: 2.3 — ABNORMAL HIGH (ref 1.2–2.2)
Albumin: 4.8 g/dL (ref 4.1–5.2)
Alkaline Phosphatase: 54 IU/L (ref 44–121)
BUN/Creatinine Ratio: 15 (ref 9–20)
BUN: 17 mg/dL (ref 6–20)
Bilirubin Total: 0.5 mg/dL (ref 0.0–1.2)
CO2: 25 mmol/L (ref 20–29)
Calcium: 9.9 mg/dL (ref 8.7–10.2)
Chloride: 102 mmol/L (ref 96–106)
Creatinine, Ser: 1.12 mg/dL (ref 0.76–1.27)
Globulin, Total: 2.1 g/dL (ref 1.5–4.5)
Glucose: 85 mg/dL (ref 70–99)
Potassium: 4.3 mmol/L (ref 3.5–5.2)
Sodium: 141 mmol/L (ref 134–144)
Total Protein: 6.9 g/dL (ref 6.0–8.5)
eGFR: 95 mL/min/{1.73_m2} (ref >59)

## 2021-10-16 LAB — CBC
Hematocrit: 45.4 % (ref 37.5–51.0)
Hemoglobin: 15.2 g/dL (ref 13.0–17.7)
MCH: 27.9 pg (ref 26.6–33.0)
MCHC: 33.5 g/dL (ref 31.5–35.7)
MCV: 84 fL (ref 79–97)
Platelets: 254 10*3/uL (ref 150–450)
RBC: 5.44 x10E6/uL (ref 4.14–5.80)
RDW: 13 % (ref 11.6–15.4)
WBC: 5.8 10*3/uL (ref 3.4–10.8)

## 2021-10-16 LAB — LIPID PANEL
Chol/HDL Ratio: 2.3 ratio (ref 0.0–5.0)
Cholesterol, Total: 134 mg/dL (ref 100–199)
HDL: 58 mg/dL (ref >39)
LDL Chol Calc (NIH): 65 mg/dL (ref 0–99)
Triglycerides: 49 mg/dL (ref 0–149)
VLDL Cholesterol Cal: 11 mg/dL (ref 5–40)

## 2021-10-16 LAB — VITAMIN D 25 HYDROXY (VIT D DEFICIENCY, FRACTURES): Vit D, 25-Hydroxy: 8.2 ng/mL — ABNORMAL LOW (ref 30.0–100.0)

## 2021-10-16 LAB — APO A1 + B + RATIO
Apolipo. B/A-1 Ratio: 0.4 ratio (ref 0.0–0.7)
Apolipoprotein A-1: 139 mg/dL (ref 101–178)
Apolipoprotein B: 51 mg/dL (ref <90)

## 2021-10-16 LAB — TSH: TSH: 1.63 u[IU]/mL (ref 0.450–4.500)

## 2021-10-16 LAB — HIV ANTIBODY (ROUTINE TESTING W REFLEX): HIV Screen 4th Generation wRfx: NONREACTIVE

## 2021-10-16 LAB — HEPATITIS C ANTIBODY: Hep C Virus Ab: NONREACTIVE

## 2021-10-22 ENCOUNTER — Other Ambulatory Visit: Payer: Self-pay | Admitting: Family Medicine

## 2021-10-22 DIAGNOSIS — R7989 Other specified abnormal findings of blood chemistry: Secondary | ICD-10-CM

## 2021-10-22 MED ORDER — VITAMIN D (ERGOCALCIFEROL) 1.25 MG (50000 UNIT) PO CAPS: 50000.0000 [IU] | ORAL_CAPSULE | ORAL | 0 refills | Status: AC

## 2021-10-22 NOTE — Progress Notes (Signed)
PC to pt, discussed results, pt voiced understanding.

## 2022-04-29 ENCOUNTER — Ambulatory Visit: Payer: Self-pay | Admitting: *Deleted

## 2022-04-29 ENCOUNTER — Encounter: Payer: Self-pay | Admitting: Emergency Medicine

## 2022-04-29 ENCOUNTER — Telehealth: Payer: BC Managed Care – PPO | Admitting: Internal Medicine

## 2022-04-29 ENCOUNTER — Encounter: Payer: Self-pay | Admitting: Internal Medicine

## 2022-04-29 ENCOUNTER — Ambulatory Visit: Payer: Self-pay

## 2022-04-29 ENCOUNTER — Ambulatory Visit
Admission: EM | Admit: 2022-04-29 | Discharge: 2022-04-29 | Disposition: A | Discharge status: Home / Self Care | Payer: BC Managed Care – PPO | Attending: Emergency Medicine | Admitting: Emergency Medicine

## 2022-04-29 DIAGNOSIS — U071 COVID-19: Secondary | ICD-10-CM

## 2022-04-29 DIAGNOSIS — E559 Vitamin D deficiency, unspecified: Secondary | ICD-10-CM | POA: Insufficient documentation

## 2022-04-29 MED ORDER — NIRMATRELVIR/RITONAVIR (PAXLOVID)TABLET: 3.0000 | ORAL_TABLET | Freq: Two times a day (BID) | ORAL | 0 refills | Status: AC

## 2022-04-29 NOTE — Discharge Instructions (Signed)
You will have to quarantine for 5 days from the start of your symptoms.  After 5 days you can break quarantine if your symptoms have improved and you have not had a fever for 24 hours without taking Tylenol or ibuprofen.  Use over-the-counter Tylenol and ibuprofen as needed for body aches and fever.  Take the Paxlovid twice daily for 5 days for treatment of COVID-19.  If you develop any increased shortness of breath-especially at rest, you are unable to speak in full sentences, or is a late sign your lips are turning blue you need to go the ER for evaluation.

## 2022-04-29 NOTE — Telephone Encounter (Signed)
Mother calling in doubting results of a Covid test.   Dennis Maldonado is testing positive.   She is worried because of her and husband's age.    Mother having trouble getting to the site.    He is for a virtual visit.     The home test says most likely get false positives.    Mother has many questions.   He doesn't know how to do virtual visit. Reason for Disposition  Health Information question, no triage required and triager able to answer question  Answer Assessment - Initial Assessment Questions 1. REASON FOR CALL or QUESTION: "What is your reason for calling today?" or "How can I best help you?" or "What question do you have that I can help answer?"     Mother calling in not on DPR.   Son Akia, in the background on another line talking with mother and she was relaying the messages to me.    Most of the questions were from the mother regarding the video call.   I answered her many questions, talked with Prajwal also and gave him the MyChart help desk number because he is not set up on [place] and is having trouble getting set up.  He is requesting a note for work but since the appt is virtual the note will need to be sent to him via [place]. I instructed pt to call back if he changes his mind and decides to go to the urgent care.   He was agreeable.   Also so if   He is positive for Covid.   He was triaged earlier today by another triage nurse from [facility] and appt made for this afternoon virtually at 4:00 with Dr. Army Melia.  Protocols used: Information Only Call - No Triage-A-AH

## 2022-04-29 NOTE — ED Provider Notes (Signed)
[facility]    CSN: [id number] Arrival date & time: 04/29/22  1013      History   Chief Complaint Chief Complaint  Patient presents with   Covid Positive    HPI SHAYON TROMPETER is a 26 y.o. male.   HPI  26 year old male here for evaluation after testing COVID-positive.  Patient reports that he developed symptoms of headache, sweating, and fever with a Tmax of 101 yesterday.  He tested positive for COVID this morning.  He is here requesting a note for work.  She denies any runny nose, nasal congestion, ear pain, sore throat, cough, shortness breath, or wheezing.  Still with significant past medical history of vitamin D deficiency for which she is not taking a supplement.  History reviewed. No pertinent past medical history.  Patient Active Problem List   Diagnosis Date Noted   Vitamin D deficiency 04/29/2022    Past Surgical History:  Procedure Laterality Date   NO PAST SURGERIES         Home Medications    Prior to Admission medications   Medication Sig Start Date End Date Taking? Authorizing Provider  nirmatrelvir/ritonavir EUA (PAXLOVID) 20 x 150 MG & 10 x 100MG  TABS Take 3 tablets by mouth 2 (two) times daily for 5 days. 04/29/22 05/04/22 Yes Margarette Canada, NP  Vitamin D, Ergocalciferol, (DRISDOL) 1.25 MG (50000 UNIT) CAPS capsule Take 1 capsule (50,000 Units total) by mouth every 7 (seven) days. Take for 8 total doses(weeks) 10/22/21   Montel Culver, MD    Family History Family History  Problem Relation Age of Onset   Hypertension Maternal Grandmother    Cancer Maternal Grandmother        Liver   Cancer Maternal Grandfather        Colon    Social History Social History   Tobacco Use   Smoking status: Every Day    Types: Cigars   Smokeless tobacco: Never  Vaping Use   Vaping Use: Never used  Substance Use Topics   Alcohol use: Yes    Alcohol/week: 2.0 standard drinks of alcohol    Types: 2 Cans of beer per week   Drug use: Yes     Types: Marijuana     Allergies   Patient has no known allergies.   Review of Systems Review of Systems  Constitutional:  Positive for diaphoresis and fever.  HENT:  Negative for congestion, ear pain, rhinorrhea and sore throat.   Respiratory:  Negative for cough, shortness of breath and wheezing.   Neurological:  Positive for headaches.     Physical Exam Triage Vital Signs ED Triage Vitals  Enc Vitals Group     BP 04/29/22 1110 125/72     Pulse Rate 04/29/22 1110 (!) 51     Resp 04/29/22 1110 16     Temp 04/29/22 1110 98.9 F (37.2 C)     Temp Source 04/29/22 1110 Oral     SpO2 04/29/22 1110 96 %     Weight 04/29/22 1107 166 lb 3.6 oz (75.4 kg)     Height 04/29/22 1107 6' (1.829 m)     Head Circumference --      Peak Flow --      Pain Score 04/29/22 1106 0     Pain Loc --      Pain Edu? --      Excl. in [place]? --    No data found.  Updated Vital Signs BP 125/72 (BP Location: Right  Arm)   Pulse (!) 51   Temp 98.9 F (37.2 C) (Oral)   Resp 16   Ht 6' (1.829 m)   Wt 166 lb 3.6 oz (75.4 kg)   SpO2 96%   BMI 22.54 kg/m   Visual Acuity Right Eye Distance:   Left Eye Distance:   Bilateral Distance:    Right Eye Near:   Left Eye Near:    Bilateral Near:     Physical Exam Vitals and nursing note reviewed.  Constitutional:      Appearance: He is not ill-appearing.  HENT:     Head: Normocephalic and atraumatic.     Right Ear: Tympanic membrane, ear canal and external ear normal. There is no impacted cerumen.     Left Ear: Tympanic membrane, ear canal and external ear normal. There is no impacted cerumen.     Mouth/Throat:     Mouth: Mucous membranes are moist.     Pharynx: Oropharynx is clear. No oropharyngeal exudate or posterior oropharyngeal erythema.  Cardiovascular:     Rate and Rhythm: Normal rate.     Pulses: Normal pulses.     Heart sounds: Normal heart sounds. No murmur heard.    No friction rub. No gallop.  Pulmonary:     Effort: Pulmonary  effort is normal.     Breath sounds: Normal breath sounds. No wheezing, rhonchi or rales.  Skin:    General: Skin is warm and dry.     Capillary Refill: Capillary refill takes less than 2 seconds.     Findings: No erythema or rash.  Neurological:     General: No focal deficit present.     Mental Status: He is alert and oriented to person, place, and time.  Psychiatric:        Mood and Affect: Mood normal.        Behavior: Behavior normal.        Thought Content: Thought content normal.        Judgment: Judgment normal.      UC Treatments / Results  Labs (all labs ordered are listed, but only abnormal results are displayed) Labs Reviewed - No data to display  EKG   Radiology No results found.  Procedures Procedures (including critical care time)  Medications Ordered in UC Medications - No data to display  Initial Impression / Assessment and Plan / UC Course  I have reviewed the triage vital signs and the nursing notes.  Pertinent labs & imaging results that were available during my care of the patient were reviewed by me and considered in my medical decision making (see chart for details).   Patient is a pleasant, nontoxic-appearing 26 year old male who is here requesting a work note after testing COVID-positive this morning.  He is not having any respiratory symptoms and is not in any respiratory distress.  He can speak in full sentences without dyspnea or tachypnea.  His only symptoms consist of fever with a Tmax of 101, headache, and sweating.  He does have a history of vitamin D deficiency and is not taking a supplement but no other past medical history.  He is interested in the antivirals and I have a CMP in epic from March of this year showing a GFR of 95.  I will discharge him home on Paxlovid twice daily for 5 days and give him a work note to cover him for the quarantine duration.  I discussed ER precautions with him as well as return precautions.  If he  develops any  respiratory symptoms he can try over-the-counter cough and congestion preparations.   Final Clinical Impressions(s) / UC Diagnoses   Final diagnoses:  [id number]     Discharge Instructions      You will have to quarantine for 5 days from the start of your symptoms.  After 5 days you can break quarantine if your symptoms have improved and you have not had a fever for 24 hours without taking Tylenol or ibuprofen.  Use over-the-counter Tylenol and ibuprofen as needed for body aches and fever.  Take the Paxlovid twice daily for 5 days for treatment of COVID-19.  If you develop any increased shortness of breath-especially at rest, you are unable to speak in full sentences, or is a late sign your lips are turning blue you need to go the ER for evaluation.      ED Prescriptions     Medication Sig Dispense Auth. Provider   nirmatrelvir/ritonavir EUA (PAXLOVID) 20 x 150 MG & 10 x 100MG  TABS Take 3 tablets by mouth 2 (two) times daily for 5 days. 30 tablet Margarette Canada, NP      PDMP not reviewed this encounter.   Margarette Canada, NP 04/29/22 1123

## 2022-04-29 NOTE — Telephone Encounter (Signed)
  Chief Complaint: Mother had a lot of questions that I answered regarding the video call for 4:00 with Dr. Army Melia for today.   Already triaged by another triage nurse earlier today. Symptoms: Covid positive Frequency: Now  See triage notes from earlier today Pertinent Negatives: Patient denies N/A Disposition: [] ED /[] Urgent Care (no appt availability in office) / [x] Appointment(In office/virtual)/ []  [place] [facility]/ [] Home Care/ [] Refused Recommended Disposition /[] [place] [facility]/ []  Follow-up with PCP Additional Notes: Answered questions

## 2022-04-29 NOTE — Telephone Encounter (Signed)
Pt mother is calling to report that the patient tested postive for COVID yesterday. Sx include temp of 101 yesterday, today 99.9, with chills, with headache   Chief Complaint: COVID 19 positive Symptoms: Headache,fever, chills Frequency: Yesterday Pertinent Negatives: Patient denies SOB Disposition: [] ED /[] Urgent Care (no appt availability in office) / [x] Appointment(In office/virtual)/ []  [place] [facility]/ [] Home Care/ [] Refused Recommended Disposition /[] [place] [facility]/ []  Follow-up with PCP Additional Notes:   Answer Assessment - Initial Assessment Questions 1. COVID-19 DIAGNOSIS: "How do you know that you have COVID?" (e.g., positive lab test or self-test, diagnosed by doctor or NP/PA, symptoms after exposure).     Home test 2. COVID-19 EXPOSURE: "Was there any known exposure to COVID before the symptoms began?" CDC Definition of close contact: within 6 feet (2 meters) for a total of 15 minutes or more over a 24-hour period.      No 3. ONSET: "When did the COVID-19 symptoms start?"      Sunday 4. WORST SYMPTOM: "What is your worst symptom?" (e.g., cough, fever, shortness of breath, muscle aches)     Headache 5. COUGH: "Do you have a cough?" If Yes, ask: "How bad is the cough?"       No 6. FEVER: "Do you have a fever?" If Yes, ask: "What is your temperature, how was it measured, and when did it start?"     99  7. RESPIRATORY STATUS: "Describe your breathing?" (e.g., normal; shortness of breath, wheezing, unable to speak)      No 8. BETTER-SAME-WORSE: "Are you getting better, staying the same or getting worse compared to yesterday?"  If getting worse, ask, "In what way?"     Better 9. OTHER SYMPTOMS: "Do you have any other symptoms?"  (e.g., chills, fatigue, headache, loss of smell or taste, muscle pain, sore throat)     Chills 10. HIGH RISK DISEASE: "Do you have any chronic medical problems?" (e.g., asthma, heart or lung disease, weak immune system, obesity, etc.)        No 11. VACCINE: "Have you had the COVID-19 vaccine?" If Yes, ask: "Which one, how many shots, when did you get it?"       N/a 12. PREGNANCY: "Is there any chance you are pregnant?" "When was your last menstrual period?"       N/a 13. O2 SATURATION MONITOR:  "Do you use an oxygen saturation monitor (pulse oximeter) at home?" If Yes, ask "What is your reading (oxygen level) today?" "What is your usual oxygen saturation reading?" (e.g., 95%)       No  Protocols used: Coronavirus (COVID-19) Diagnosed or Suspected-A-AH

## 2022-04-29 NOTE — ED Triage Notes (Addendum)
Pt c/o headache, sweats, and fever. Started yesterday morning. Pt tested positive for covid at home this morning. Pt states he needs a note for work.

## 2022-10-16 ENCOUNTER — Encounter: Payer: Self-pay | Admitting: Family Medicine

## 2022-10-18 ENCOUNTER — Encounter: Payer: Self-pay | Admitting: Family Medicine

## 2022-10-22 ENCOUNTER — Encounter: Payer: Self-pay | Admitting: Family Medicine
